# Patient Record
Sex: Male | Born: 2000 | Race: White | Hispanic: No | Marital: Single | Smoking: Never smoker
Health system: Southern US, Community
[De-identification: ages and names within clinical notes are randomized; demographics above are authoritative.]

## PROBLEM LIST (undated history)

## (undated) DIAGNOSIS — F909 Attention-deficit hyperactivity disorder, unspecified type: Secondary | ICD-10-CM

---
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
## 2021-02-28 IMAGING — CR DG TIBIA/FIBULA 2V*R*
1 series · 2 of 2 positions shown · non-contrast
Comparison: None.

CLINICAL DATA: Swelling and pain due to injury

EXAM:
RIGHT TIBIA AND FIBULA - 2 VIEW

[Series 1: dg tibia/fibula right · 0.14mm/px · 2 of 2 slices shown]
[im 1/2]
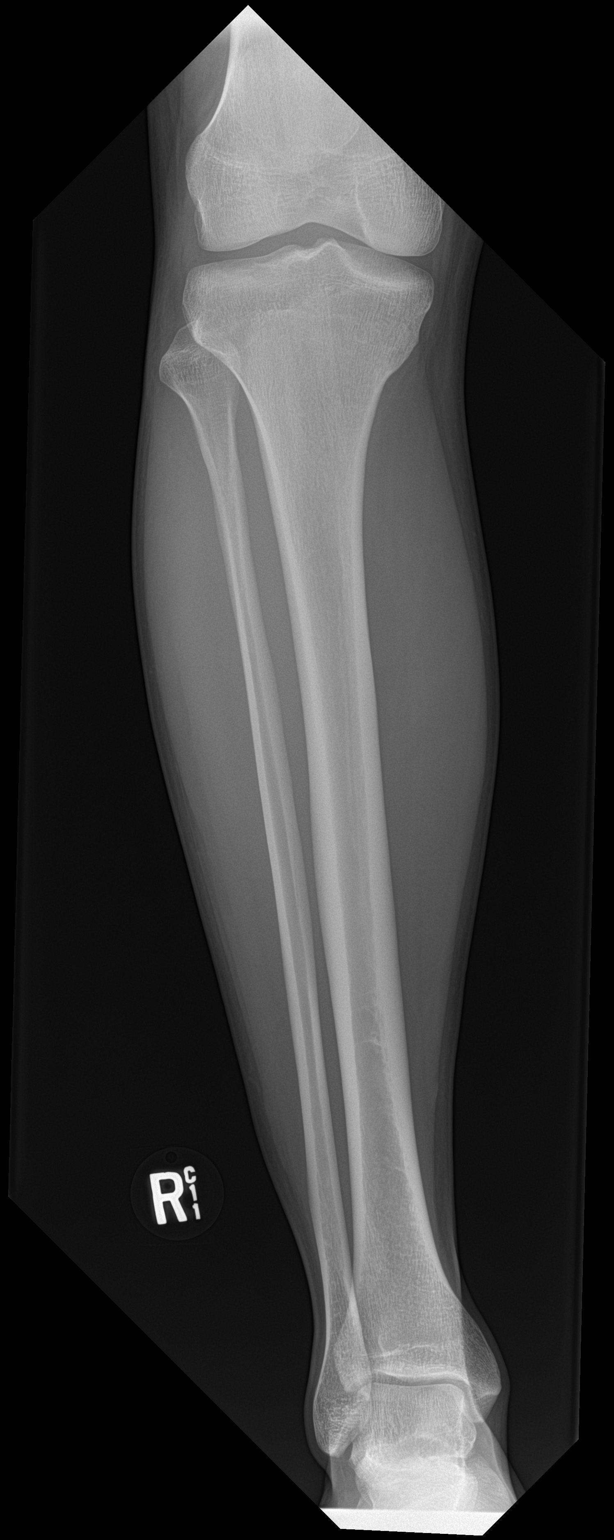
[im 2/2]
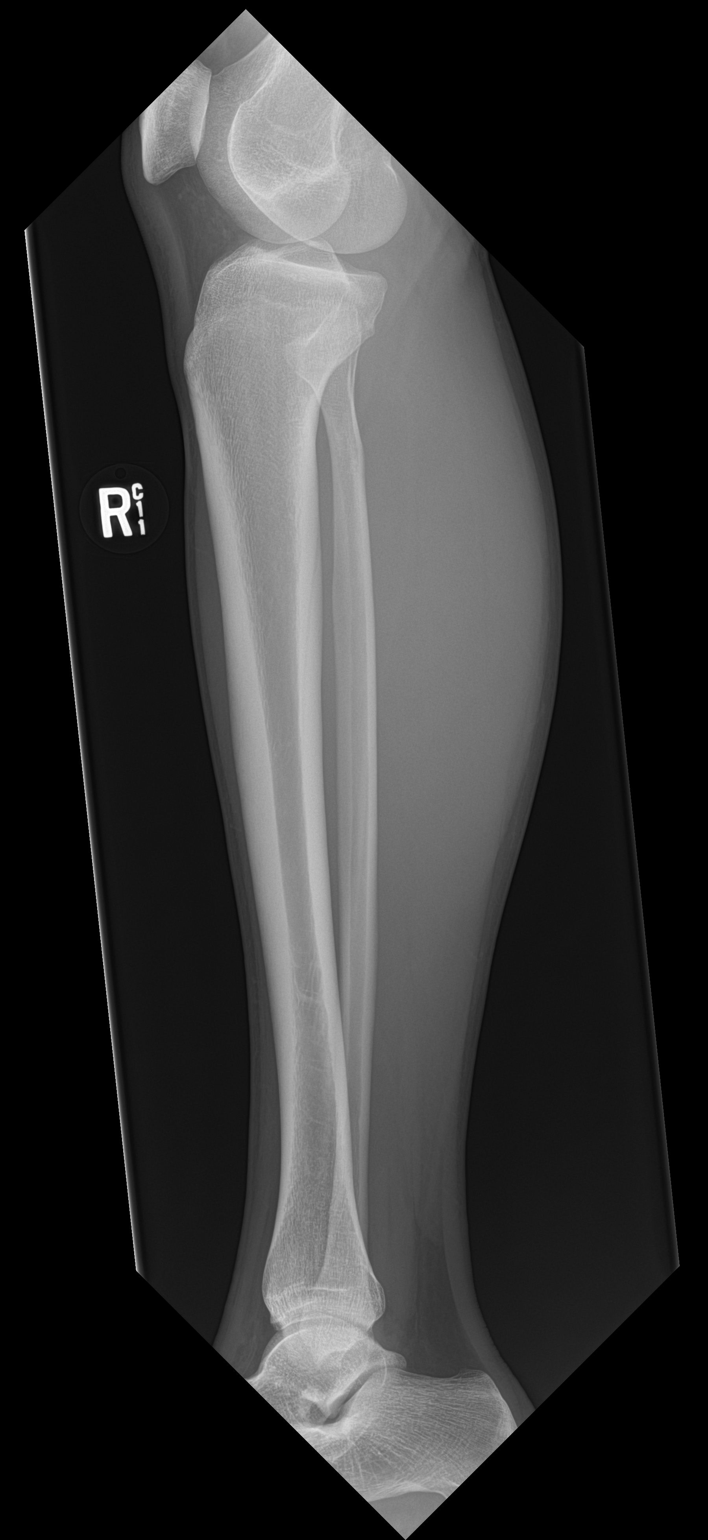

[2 of 2 positions shown; findings below may reference images not displayed]

FINDINGS: There is no evidence of fracture or other focal bone lesions. Soft
tissues are unremarkable.
IMPRESSION: Negative.

## 2021-02-28 IMAGING — CR DG FOOT COMPLETE 3+V*R*
1 series · 3 of 3 positions shown · non-contrast
Comparison: None.

CLINICAL DATA: Pain and swelling due to injury

EXAM:
RIGHT FOOT COMPLETE - 3+ VIEW

[Series 1: dg foot complete right · 0.14mm/px · 3 of 3 slices shown]
[im 1/3]
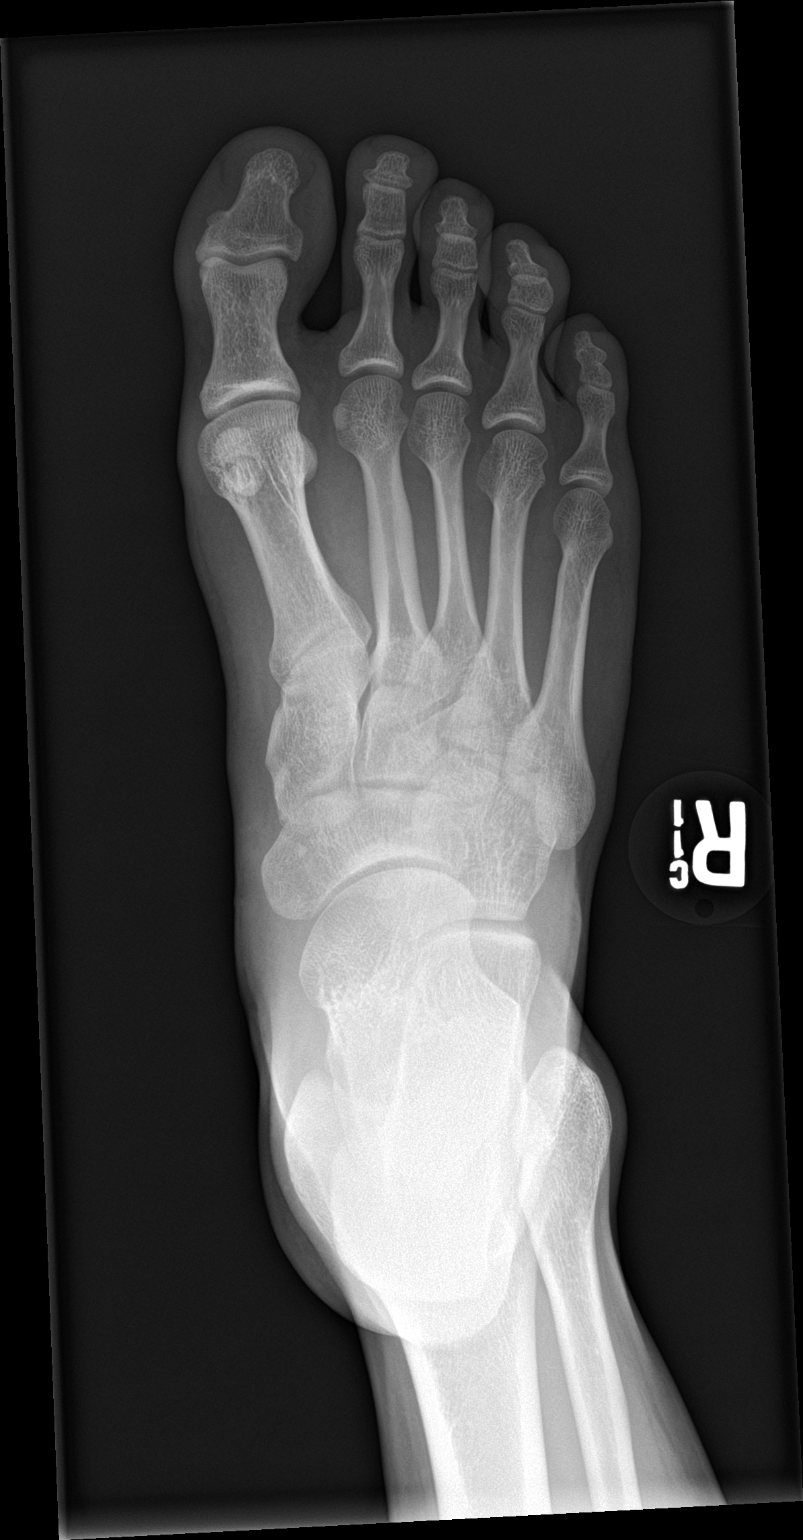
[im 2/3]
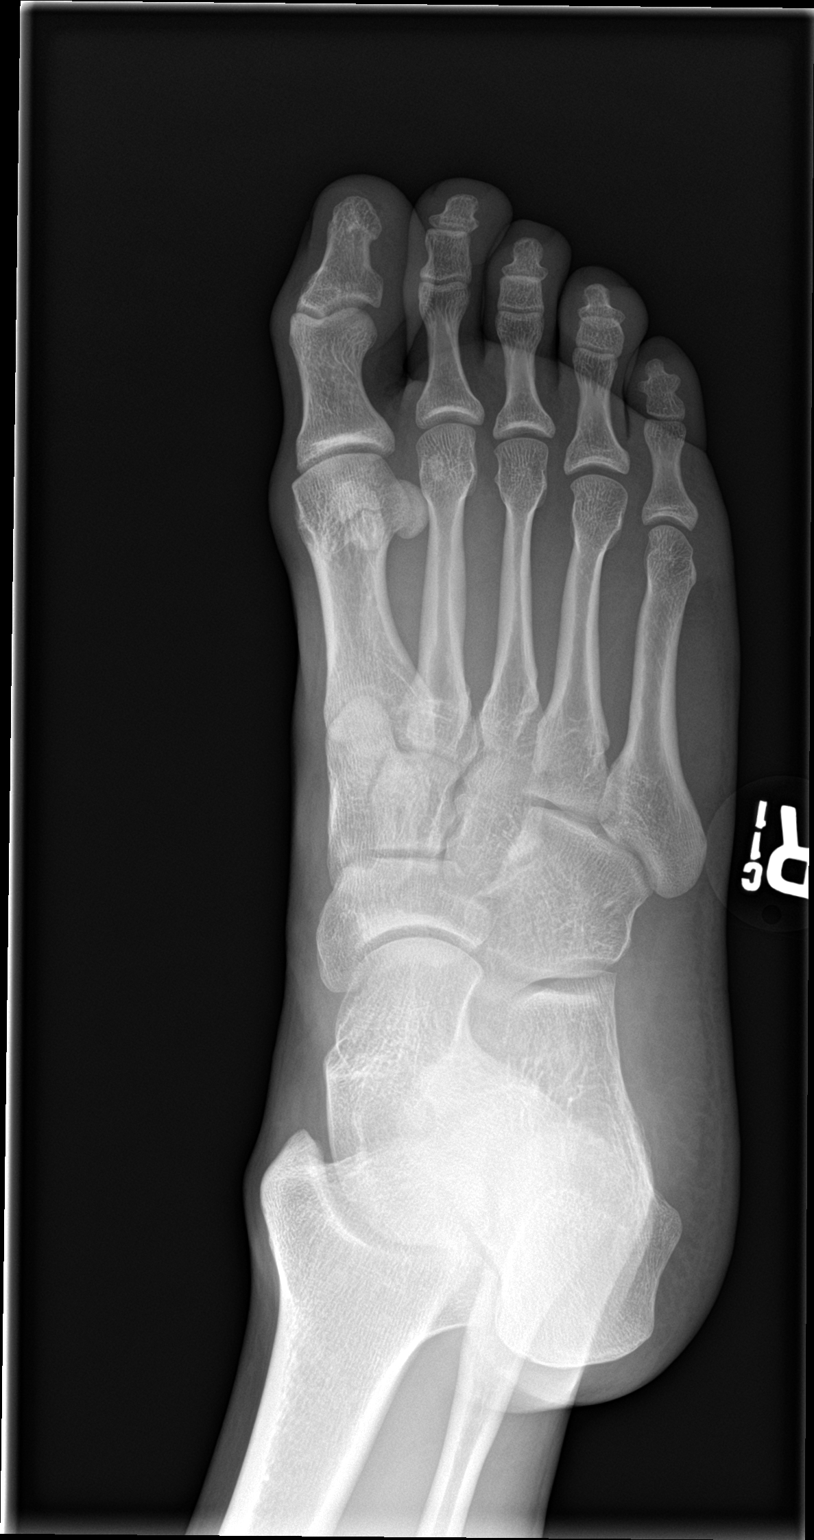
[im 3/3]
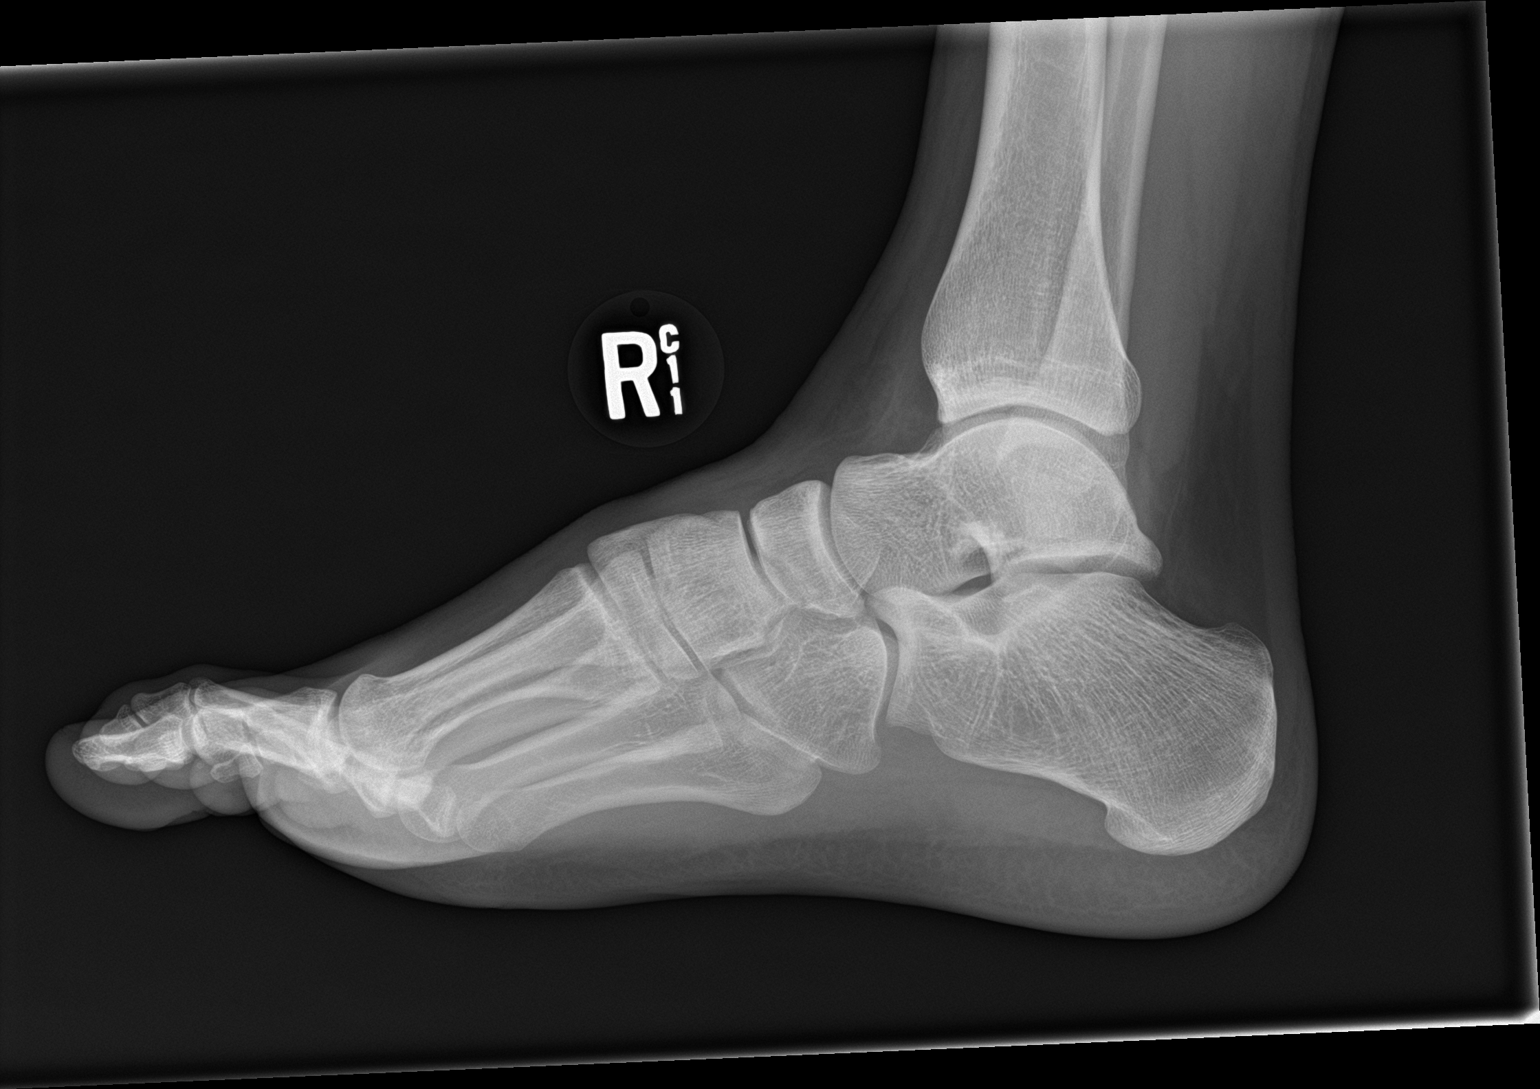

[3 of 3 positions shown; findings below may reference images not displayed]

FINDINGS: There is no evidence of fracture or dislocation. There is no
evidence of arthropathy or other focal bone abnormality. Soft
tissues are unremarkable.
IMPRESSION: Negative.
